# Patient Record
Sex: Male | Born: 1950 | Race: Black or African American | Hispanic: No | Marital: Single | Smoking: Former smoker
Health system: Southern US, Community
[De-identification: ages and names within clinical notes are randomized; demographics above are authoritative.]

## PROBLEM LIST (undated history)

## (undated) DIAGNOSIS — M1990 Unspecified osteoarthritis, unspecified site: Secondary | ICD-10-CM

## (undated) DIAGNOSIS — I10 Essential (primary) hypertension: Secondary | ICD-10-CM

## (undated) HISTORY — DX: Unspecified osteoarthritis, unspecified site: M19.90

## (undated) HISTORY — DX: Essential (primary) hypertension: I10

---

## 1980-10-17 HISTORY — PX: APPENDECTOMY: SHX54

## 1998-06-16 ENCOUNTER — Emergency Department (HOSPITAL_COMMUNITY): Admission: EM | Admit: 1998-06-16 | Discharge: 1998-06-16 | Payer: Self-pay | Admitting: Emergency Medicine

## 1998-06-22 ENCOUNTER — Encounter: Admission: RE | Admit: 1998-06-22 | Discharge: 1998-06-22 | Payer: Self-pay | Admitting: *Deleted

## 2001-12-05 ENCOUNTER — Emergency Department (HOSPITAL_COMMUNITY): Admission: EM | Admit: 2001-12-05 | Discharge: 2001-12-05 | Payer: Self-pay | Admitting: Emergency Medicine

## 2004-11-24 ENCOUNTER — Emergency Department (HOSPITAL_COMMUNITY): Admission: EM | Admit: 2004-11-24 | Discharge: 2004-11-24 | Payer: Self-pay | Admitting: Family Medicine

## 2006-01-09 ENCOUNTER — Ambulatory Visit: Payer: Self-pay | Admitting: Cardiology

## 2006-01-10 ENCOUNTER — Inpatient Hospital Stay (HOSPITAL_COMMUNITY): Admission: EM | Admit: 2006-01-10 | Discharge: 2006-01-11 | Payer: Self-pay | Admitting: Emergency Medicine

## 2006-08-28 ENCOUNTER — Emergency Department (HOSPITAL_COMMUNITY): Admission: EM | Admit: 2006-08-28 | Discharge: 2006-08-28 | Payer: Self-pay | Admitting: Emergency Medicine

## 2015-12-17 ENCOUNTER — Emergency Department (HOSPITAL_COMMUNITY)
Admission: EM | Admit: 2015-12-17 | Discharge: 2015-12-17 | Disposition: A | Discharge status: Home / Self Care | Payer: Self-pay | Attending: Emergency Medicine | Admitting: Emergency Medicine

## 2015-12-17 ENCOUNTER — Encounter (HOSPITAL_COMMUNITY): Payer: Self-pay | Admitting: Emergency Medicine

## 2015-12-17 ENCOUNTER — Emergency Department (HOSPITAL_COMMUNITY): Payer: Self-pay

## 2015-12-17 DIAGNOSIS — F17210 Nicotine dependence, cigarettes, uncomplicated: Secondary | ICD-10-CM | POA: Insufficient documentation

## 2015-12-17 DIAGNOSIS — W19XXXA Unspecified fall, initial encounter: Secondary | ICD-10-CM

## 2015-12-17 DIAGNOSIS — Y998 Other external cause status: Secondary | ICD-10-CM | POA: Insufficient documentation

## 2015-12-17 DIAGNOSIS — S4992XA Unspecified injury of left shoulder and upper arm, initial encounter: Secondary | ICD-10-CM | POA: Insufficient documentation

## 2015-12-17 DIAGNOSIS — Y9264 Mine or pit as the place of occurrence of the external cause: Secondary | ICD-10-CM | POA: Insufficient documentation

## 2015-12-17 DIAGNOSIS — W010XXA Fall on same level from slipping, tripping and stumbling without subsequent striking against object, initial encounter: Secondary | ICD-10-CM | POA: Insufficient documentation

## 2015-12-17 DIAGNOSIS — Y9389 Activity, other specified: Secondary | ICD-10-CM | POA: Insufficient documentation

## 2015-12-17 DIAGNOSIS — M25512 Pain in left shoulder: Secondary | ICD-10-CM

## 2015-12-17 MED ORDER — OXYCODONE-ACETAMINOPHEN 5-325 MG PO TABS: 1.0000 | ORAL_TABLET | ORAL | Status: DC | PRN

## 2015-12-17 MED ORDER — KETOROLAC TROMETHAMINE 60 MG/2ML IM SOLN
30.0000 mg | Freq: Once | INTRAMUSCULAR | Status: AC
  Administered 2015-12-17: 30 mg via INTRAMUSCULAR
  Filled 2015-12-17: qty 2

## 2015-12-17 MED ORDER — IBUPROFEN 600 MG PO TABS: 600.0000 mg | ORAL_TABLET | Freq: Four times a day (QID) | ORAL | Status: DC | PRN

## 2015-12-17 MED ORDER — METHOCARBAMOL 500 MG PO TABS: 500.0000 mg | ORAL_TABLET | Freq: Two times a day (BID) | ORAL | Status: DC

## 2015-12-17 NOTE — ED Provider Notes (Signed)
CSN: [id number]     Arrival date & time 12/17/15  1553 History  By signing my name below, I, Soijett Blue, attest that this documentation has been prepared under the direction and in the presence of Diyari Cherne, PA-C Electronically Signed: Soijett Blue, ED Scribe. 12/17/2015. 5:48 PM.    Chief Complaint  Patient presents with  . Shoulder Pain      The history is provided by the patient. No language interpreter was used.    Graiden Henes Ogburn is a 65 y.o. male who presents to the Emergency Department complaining of 8/10 left shoulder pain onset yesterday night. He notes that he tripped and fell onto gravel yesterday night and landed on his left shoulder. He notes that today at work, he began to experience pain while lifting his left arm. Denies injuring his left shoulder in the past, or having surgery on the left shoulder. He notes that he has not tried any medications for the relief of his symptoms. Patient denies LOC, head trauma, shortness of breath, chest pain, neuro deficits, or any other complaints.     History reviewed. No pertinent past medical history. History reviewed. No pertinent past surgical history. History reviewed. No pertinent family history. Social History  Substance Use Topics  . Smoking status: Current Every Day Smoker -- 0.10 packs/day    Types: Cigarettes  . Smokeless tobacco: None  . Alcohol Use: None    Review of Systems  Musculoskeletal: Positive for arthralgias (Left shoulder). Negative for back pain, joint swelling and neck pain.  Skin: Negative for color change, rash and wound.  Neurological: Negative for dizziness, light-headedness and headaches.      Allergies  Review of patient's allergies indicates no known allergies.  Home Medications   Prior to Admission medications   Medication Sig Start Date End Date Taking? Authorizing Provider  ibuprofen (ADVIL,MOTRIN) 600 MG tablet Take 1 tablet (600 mg total) by mouth every 6 (six) hours as needed.  12/17/15   Arneta Mahmood C Gwyn Mehring, PA-C  methocarbamol (ROBAXIN) 500 MG tablet Take 1 tablet (500 mg total) by mouth 2 (two) times daily. 12/17/15   Shira Bobst C Kiowa Hollar, PA-C  oxyCODONE-acetaminophen (PERCOCET/ROXICET) 5-325 MG tablet Take 1-2 tablets by mouth every 4 (four) hours as needed for severe pain. 12/17/15   Dvontae Ruan C Deborha Moseley, PA-C   BP 137/87 mmHg  Pulse 70  Temp(Src) 98 F (36.7 C) (Oral)  Resp 16  SpO2 100% Physical Exam  Constitutional: He is oriented to person, place, and time. He appears well-developed and well-nourished. No distress.  HENT:  Head: Normocephalic and atraumatic.  Eyes: Conjunctivae are normal.  Neck: Neck supple.  Cardiovascular: Normal rate and intact distal pulses.   Pulmonary/Chest: Effort normal. No respiratory distress.  Musculoskeletal: Normal range of motion.  Left shoulder has full passive and active ROM with pain. Full range of motion in all other extremities and spine. No paraspinal tenderness.  Neurological: He is alert and oriented to person, place, and time. He has normal strength and normal reflexes. No sensory deficit.  No sensory deficit. Strength 5/5.   Skin: Skin is warm and dry.  Psychiatric: He has a normal mood and affect. His behavior is normal.  Nursing note and vitals reviewed.   ED Course  Procedures (including critical care time) DIAGNOSTIC STUDIES: Oxygen Saturation is 100% on RA, nl by my interpretation.    COORDINATION OF CARE: 5:47 PM Discussed treatment plan with pt at bedside which includes left shoulder xray, toradol injection, referral and follow up  with orthopedist, and pt agreed to plan.     Imaging Review Dg Shoulder Left  12/17/2015  CLINICAL DATA:  Fall on left shoulder while running yesterday. Left shoulder pain. EXAM: LEFT SHOULDER - 2+ VIEW COMPARISON:  01/10/2006 FINDINGS: No dislocation. Subacromial morphology is type 2 (curved). AC joint alignment normal. Questionable tiny ossific structure along the inferior margin of the glenoid,  3 by 1.5 mm, conceivably a tiny fragmented spur or residua from a prior bony injury. Unlikely to be an acute bony Bankart lesion. IMPRESSION: 1. Tiny ossific structure along the inferior margin of the glenoid, probably chronic given the well corticated appearance, but not readily seen on the prior CT chest from 01/10/2006. The possibility of a bony Bankart lesion is considered low but not totally excluded. Electronically Signed   By: Gaylyn Rong M.D.   On: 12/17/2015 16:34   I have personally reviewed and evaluated these images as part of my medical decision-making.   EKG Interpretation None      MDM   Final diagnoses:  Fall, initial encounter  Shoulder pain, acute, left    Michall Noffke Rieger presents with left shoulder pain following a trip and fall yesterday.  No evidence of dislocation or fracture on x-ray. Patient has no neuro or functional deficits. Patient improved with conservative management here in the ED. Home care and return precautions discussed. Patient to follow-up with orthopedics as soon as possible. Patient placed in a shoulder immobilizer. Patient appears safe for discharge at this time.  I personally performed the services described in this documentation, which was scribed in my presence. The recorded information has been reviewed and is accurate.   Anselm Pancoast, PA-C 12/17/15 1846  Doug Sou, MD 12/18/15 Jacinta Shoe

## 2015-12-17 NOTE — ED Notes (Signed)
Pt c/o left shoulder pain after trip and fall yesterday evening. Point tenderness over left proximal humerus. Radial pulse 2+, sensation and motor function intact.

## 2015-12-17 NOTE — Discharge Instructions (Signed)
You have been seen today for shoulder pain. Your imaging showed no definite abnormalities. Follow up with orthopedics as soon as possible for reevaluation and chronic management. Follow up with PCP as needed. Return to ED should symptoms worsen.  RESOURCE GUIDE  Chronic Pain Problems: Contact [facility]  [phone] Patients need to be referred by their primary care doctor.  Insufficient Money for Medicine: Contact United Way:  call "211" or [facility] [phone].  No Primary Care Doctor: - Call [facility]  [phone] - can help you locate a primary care doctor that  accepts your insurance, provides certain services, etc. - Physician Referral Service- [phone]  Agencies that provide inexpensive medical care: - [facility]  [phone] - [facility]  [phone] - [facility]  [phone] - Women's Clinic  [phone] - Planned Parenthood  [phone] [facility]  [phone]  Medicaid-accepting [facility] Providers: - [facility]- [address]  [phone], Mon-Fri 9am-7pm, Sat 9am-1pm - [facility]- [address] [place], [address] [place]  [phone] - [facility]- [address], [address] [place]  [phone] [facility]- [address]  [phone] - Renaye Rakers- [address] [place], [address], [phone]  Only accepts [place] patients after they have their name  applied to their card  Self Pay (no insurance) in [place]: - Sickle Cell Patients: Dr Willey Blade, [facility]  [address] [place], [phone] - [facility]- [address] [place]  [phone]       [facility] [place]- [address]       -     [facility]- see information above (Speak to [employer] if you do not have insurance)       -  [facility]- [address] [place],  [phone]       -  [facility]- [address],  [phone]       -  [facility]- [address] [phone]       -  Dr Julio Sicks-  [address], [place], [phone]       -  [facility]- [address] [phone]       -  [facility]- [address], [phone], also [address], [phone]       -    [facility]- [address] [place], [phone], 1st & 3rd Saturday   every month, 10am-1pm  1) Find a Doctor and Pay Out of Pocket Although you won't have to find out who is covered by your insurance plan, it is a good idea to ask around and get recommendations. You will then need to call the office and see if the doctor you have chosen will accept you as a new patient and what types of options they offer for patients who are self-pay. Some doctors offer discounts or will set up payment plans for their patients who do not have insurance, but you will need to ask so you aren't surprised when you get to your appointment.  2) Contact Your Local Health Department Not all health departments have doctors that can see patients for sick visits, but many do, so it is worth a call to see  if yours does. If you don't know where your local health department is, you can check in your phone book. The CDC also has a tool to help you locate your state's health department, and many state websites also have listings of all of their local health departments.  3) Find a Walk-in Clinic If your illness is not likely to be very severe or complicated, you may want to try a walk in clinic. These are popping up all over the country in pharmacies, drugstores, and shopping centers. They're usually staffed by nurse practitioners or physician assistants that have been trained to treat common illnesses and complaints. They're usually fairly quick and inexpensive. However, if you have serious medical issues or chronic medical problems, these are  probably not your best option  STD Testing - [facility] Department of [facility] [place], STD Clinic, [address], [place], phone [phone] or [phone].  Monday - Friday, call for an appointment. [facility] Department of [employer], STD Clinic, [address], [place], phone [phone] or [phone].  Monday - Friday, call for an appointment.  Abuse/Neglect: [facility] Child Abuse Hotline (249)820-7964 [facility] Child Abuse Hotline 769-247-6227 (After Hours)  Emergency Shelter:  [facility] (251)245-1566  Maternity Homes: - [facility] at [facility] [place] [facility] 914-099-3311 - [facility] [phone]  MRSA Hotline #:   386-270-6536  [facility] Resources  [facility] [place]  United Way [facility] Dept. [address]                 [address]         [address] [place] [address]  Blondell Reveal Phone:  [phone]                                  Phone:  [phone]                   Phone:  [phone]  [facility], [phone] - [phone]       -     [facility] in [place], [address]                                  [phone], [facility] Child Abuse Hotline 520-323-0906 or 9525882712 (After Hours)   Behavioral Health Services  Substance Abuse Resources: - [facility] Drug [facility]  [phone] - [facility] [phone] - [facility] [place] [phone] Floydene Flock 708-225-3333 - Residential & Outpatient Substance Abuse Program  9084639054  Psychological Services: [facility]  [phone] Services  [phone] - [facility], [phone] [place]. [address] [place], ACCESS LINE:  971-333-1096 or 832-545-2701, [employer]  Dental Assistance  If unable to pay or  uninsured, contact:  [facility] or [facility]. to become qualified for the adult dental clinic.  Patients with Medicaid: [facility] [phone] [address], [phone] [address], [phone]  If unable to pay, or uninsured, contact [facility] [phone]) or [facility] Department [phone] in [place], [phone] in [facility]) to become qualified for the adult dental clinic   Other Low-Cost Community Dental Services: - [facility]- [address] [place], [place], [place], [address], [phone], [address]. [phone], 2nd and 4th Thursday of the month at 6:30am.  10 clients each day by appointment, can sometimes see walk-in patients if someone does not show for an appointment. [facility]- [address] [place], [place], [address], [phone] - [facility]- [address], [place], [place], [address], [phone] - [facility]- [phone] [facility]- [phone] [facility] Department- [phone]

## 2016-12-23 IMAGING — CR DG SHOULDER 2+V*L*
3 series · 3 of 3 positions shown · non-contrast
Comparison: 01/10/2006

CLINICAL DATA: Fall on left shoulder while running yesterday. Left
shoulder pain.

EXAM:
LEFT SHOULDER - 2+ VIEW

[w shoulder external left]
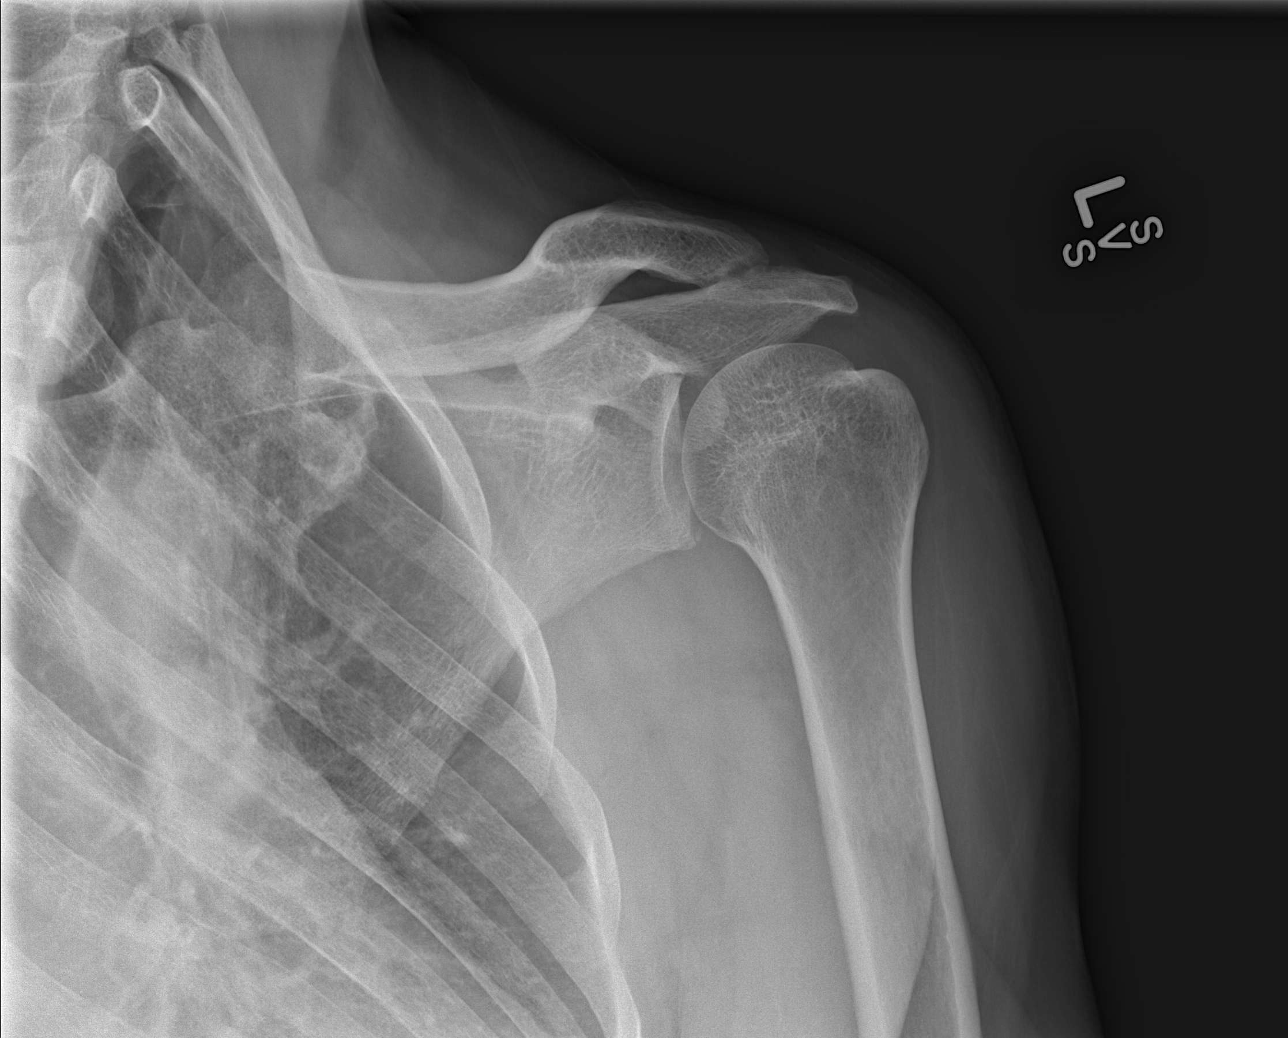

[w shoulder y-view left]
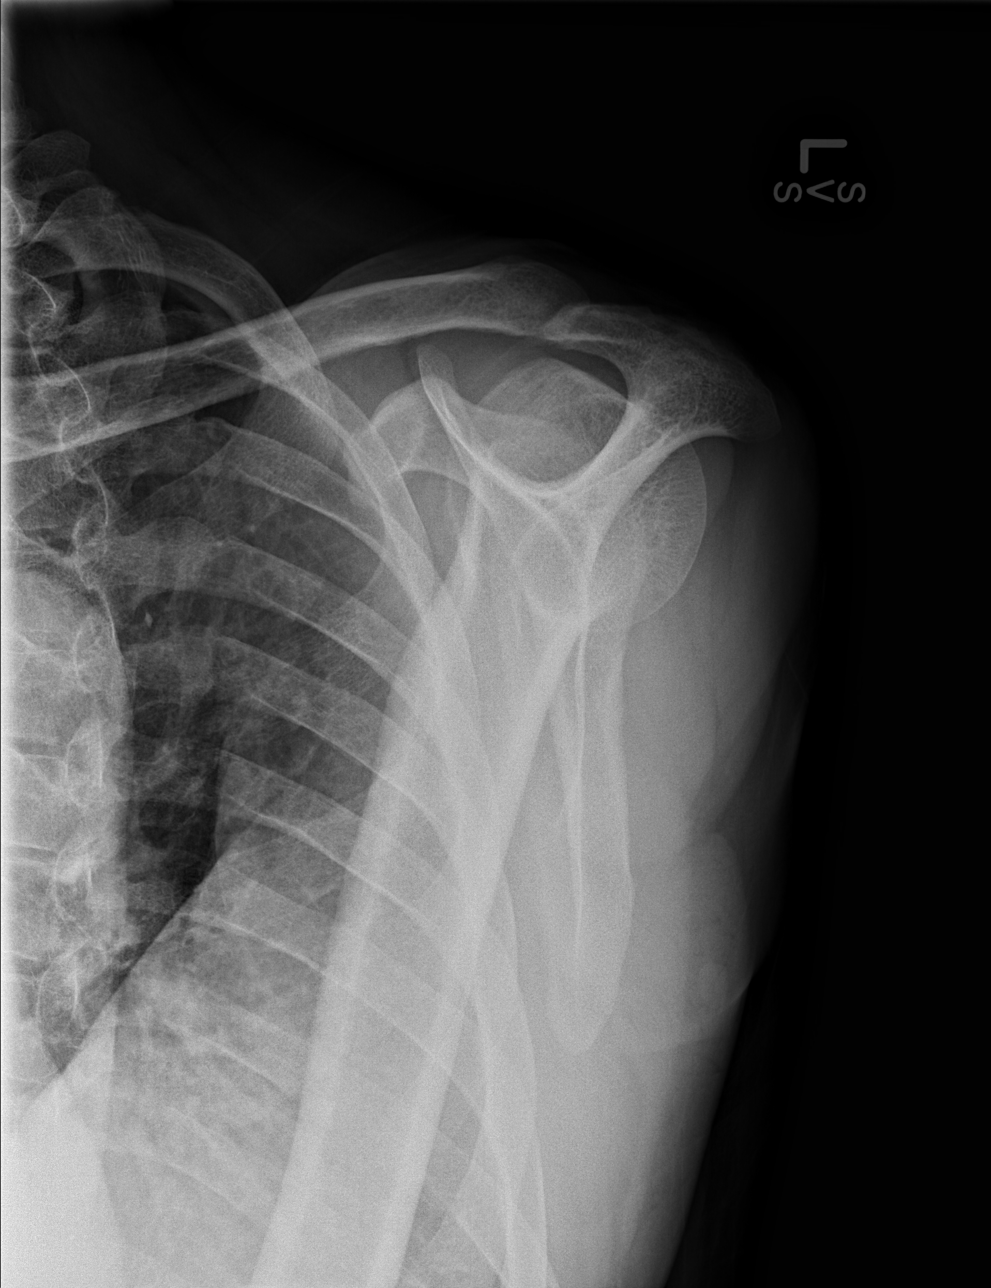

[x shoulder axillary left]
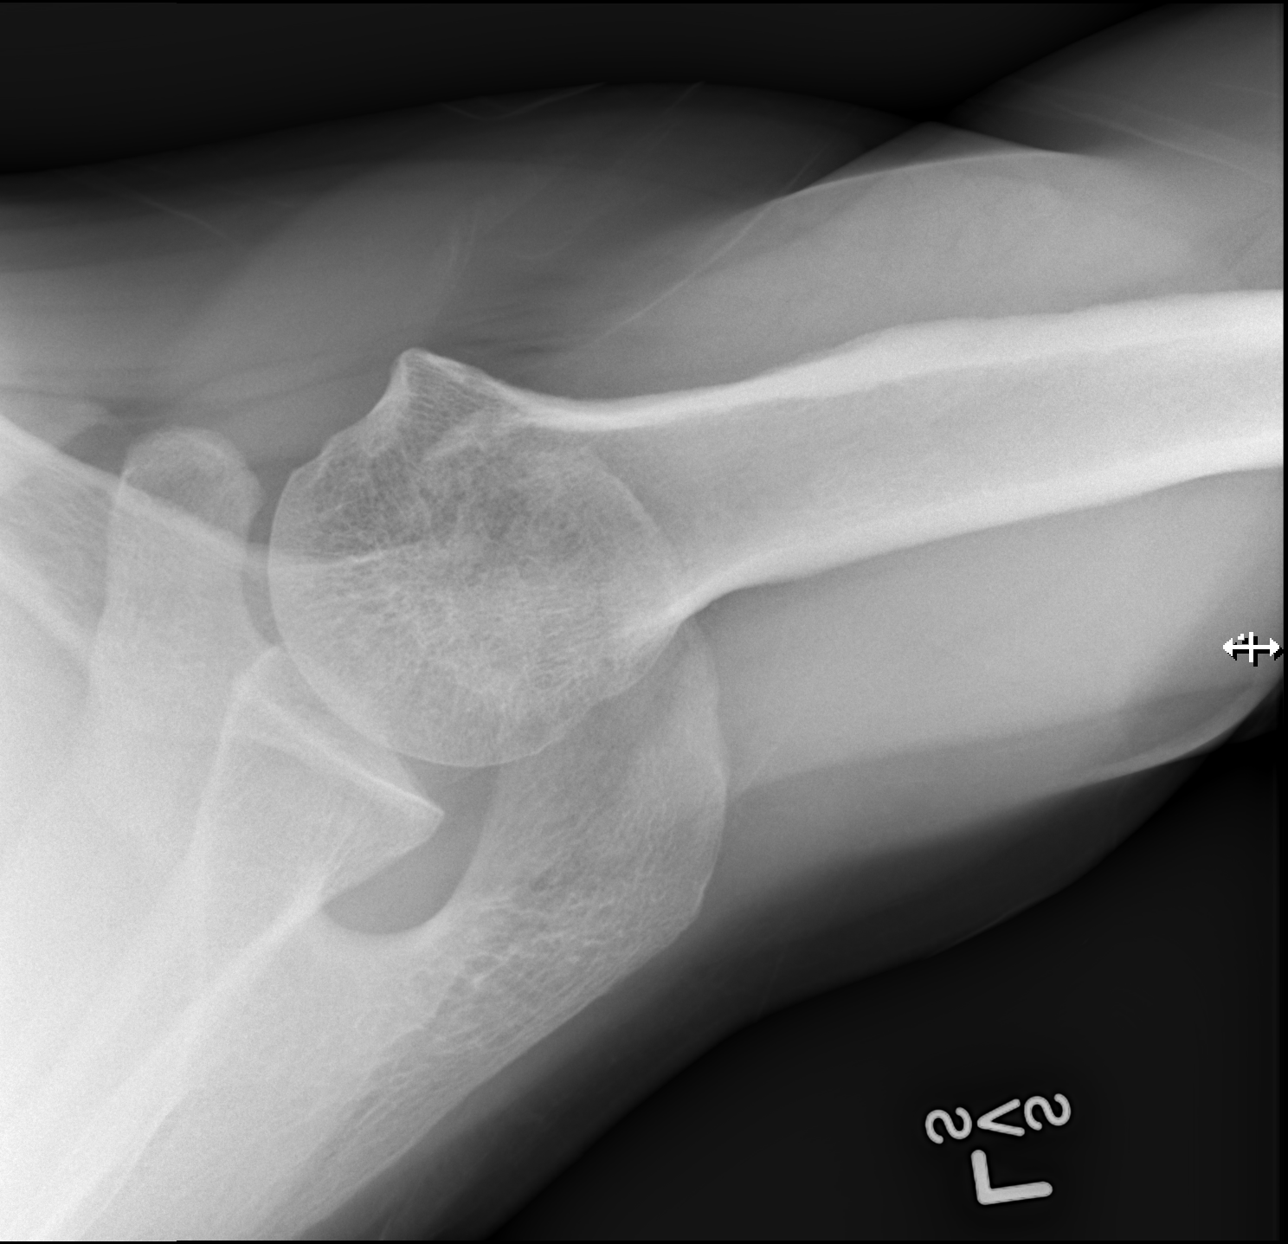

[3 of 3 positions shown; findings below may reference images not displayed]

FINDINGS: No dislocation. Subacromial morphology is type 2 (curved). AC joint
alignment normal. Questionable tiny ossific structure along the
inferior margin of the glenoid, 3 by 1.5 mm, conceivably a tiny
fragmented spur or residua from a prior bony injury. Unlikely to be
an acute bony Bankart lesion.
IMPRESSION: 1. Tiny ossific structure along the inferior margin of the glenoid,
probably chronic given the well corticated appearance, but not
readily seen on the prior CT chest from 01/10/2006. The possibility
of a bony Bankart lesion is considered low but not totally excluded.

## 2017-01-30 ENCOUNTER — Telehealth: Payer: Self-pay | Admitting: General Practice

## 2017-01-30 NOTE — Telephone Encounter (Signed)
I called the patient to confirm whether or not he has a PCP.  There was no answer and no option to leave a voicemail message. Delton Prairie (PSC)

## 2017-02-01 NOTE — Telephone Encounter (Signed)
2nd attempt to contact patient and confirm PCP.  There was no answer nor voicemail option, and the patient has returned mail. Tommy Young (PSC)

## 2017-02-03 NOTE — Telephone Encounter (Signed)
3rd attempt to contact patient and confirm PCP. There was no answer nor voicemail option. Tommy Young (PSC)

## 2017-10-18 ENCOUNTER — Encounter (HOSPITAL_COMMUNITY): Payer: Self-pay

## 2017-10-18 ENCOUNTER — Emergency Department (HOSPITAL_COMMUNITY)
Admission: EM | Admit: 2017-10-18 | Discharge: 2017-10-18 | Disposition: A | Discharge status: Home / Self Care | Payer: Medicare Other | Attending: Emergency Medicine | Admitting: Emergency Medicine

## 2017-10-18 ENCOUNTER — Other Ambulatory Visit: Payer: Self-pay

## 2017-10-18 ENCOUNTER — Emergency Department (HOSPITAL_COMMUNITY): Payer: Medicare Other

## 2017-10-18 DIAGNOSIS — J189 Pneumonia, unspecified organism: Secondary | ICD-10-CM | POA: Diagnosis not present

## 2017-10-18 DIAGNOSIS — H81399 Other peripheral vertigo, unspecified ear: Secondary | ICD-10-CM

## 2017-10-18 DIAGNOSIS — Z79899 Other long term (current) drug therapy: Secondary | ICD-10-CM | POA: Diagnosis not present

## 2017-10-18 DIAGNOSIS — R5381 Other malaise: Secondary | ICD-10-CM | POA: Diagnosis not present

## 2017-10-18 DIAGNOSIS — R05 Cough: Secondary | ICD-10-CM | POA: Diagnosis present

## 2017-10-18 DIAGNOSIS — F17210 Nicotine dependence, cigarettes, uncomplicated: Secondary | ICD-10-CM | POA: Insufficient documentation

## 2017-10-18 LAB — BASIC METABOLIC PANEL
Anion gap: 10 (ref 5–15)
BUN: 11 mg/dL (ref 6–20)
CO2: 26 mmol/L (ref 22–32)
CREATININE: 1.31 mg/dL — AB (ref 0.61–1.24)
Calcium: 9 mg/dL (ref 8.9–10.3)
Chloride: 100 mmol/L — ABNORMAL LOW (ref 101–111)
GFR calc Af Amer: >60 mL/min (ref >60)
GFR, EST NON AFRICAN AMERICAN: 55 mL/min — AB (ref >60)
GLUCOSE: 133 mg/dL — AB (ref 65–99)
POTASSIUM: 3.3 mmol/L — AB (ref 3.5–5.1)
SODIUM: 136 mmol/L (ref 135–145)

## 2017-10-18 LAB — CBC
HEMATOCRIT: 46.5 % (ref 39.0–52.0)
Hemoglobin: 15.7 g/dL (ref 13.0–17.0)
MCH: 28.9 pg (ref 26.0–34.0)
MCHC: 33.8 g/dL (ref 30.0–36.0)
MCV: 85.5 fL (ref 78.0–100.0)
PLATELETS: 196 10*3/uL (ref 150–400)
RBC: 5.44 MIL/uL (ref 4.22–5.81)
RDW: 13.5 % (ref 11.5–15.5)
WBC: 4.1 10*3/uL (ref 4.0–10.5)

## 2017-10-18 LAB — I-STAT TROPONIN, ED: Troponin i, poc: 0.01 ng/mL (ref 0.00–0.08)

## 2017-10-18 MED ORDER — MECLIZINE HCL 25 MG PO TABS: 25.0000 mg | ORAL_TABLET | Freq: Three times a day (TID) | ORAL | 0 refills | Status: AC | PRN

## 2017-10-18 MED ORDER — DOXYCYCLINE HYCLATE 100 MG PO CAPS: 100.0000 mg | ORAL_CAPSULE | Freq: Two times a day (BID) | ORAL | 0 refills | Status: DC

## 2017-10-18 MED ORDER — MECLIZINE HCL 25 MG PO TABS
25.0000 mg | ORAL_TABLET | Freq: Once | ORAL | Status: AC
  Administered 2017-10-18: 25 mg via ORAL
  Filled 2017-10-18: qty 1

## 2017-10-18 MED ORDER — SODIUM CHLORIDE 0.9 % IV BOLUS (SEPSIS)
1000.0000 mL | Freq: Once | INTRAVENOUS | Status: AC
  Administered 2017-10-18: 1000 mL via INTRAVENOUS

## 2017-10-18 NOTE — ED Triage Notes (Signed)
Patient complains of dizziness with position change since Friday. Also has had cough with chest wall pain and headache. Alert and oriented, no neuro deficits

## 2017-10-18 NOTE — ED Provider Notes (Signed)
[facility] EMERGENCY DEPARTMENT Provider Note   CSN: [id number] Arrival date & time: 10/18/17  1023     History   Chief Complaint Chief Complaint  Patient presents with  . dizziness/CP    HPI Tommy Young is a 67 y.o. male.  Patient is a 67 year old male with no significant past medical history presenting today with cough, malaise and dizziness.  Patient states approximately 6 days ago he started not feeling well.  He complains of a dry cough, bilateral rib pain and no appetite.  Family is concerned that he is dehydrated because he has not been eating or drinking.  Patient denies any regular alcohol use and does not smoke regularly.  He also has noticed dizziness over the last 6 days but worse in the last 2.  He describes it as a's bending and steady sensation when he tries to walk.  He states he feels like he stumbling around and has to hold on the wall to steady himself.  No loss of consciousness or syncope.  Moving his head side to side standing up and attempting to walk make the symptoms much worse.  He has had some nausea but denies any vomiting.  No fever, diarrhea, abdominal pain or shortness of breath.   The history is provided by the patient and a relative.    History reviewed. No pertinent past medical history.  There are no active problems to display for this patient.   History reviewed. No pertinent surgical history.     Home Medications    Prior to Admission medications   Medication Sig Start Date End Date Taking? Authorizing Provider  ibuprofen (ADVIL,MOTRIN) 600 MG tablet Take 1 tablet (600 mg total) by mouth every 6 (six) hours as needed. 12/17/15   Joy, Shawn C, PA-C  methocarbamol (ROBAXIN) 500 MG tablet Take 1 tablet (500 mg total) by mouth 2 (two) times daily. 12/17/15   Joy, Shawn C, PA-C  oxyCODONE-acetaminophen (PERCOCET/ROXICET) 5-325 MG tablet Take 1-2 tablets by mouth every 4 (four) hours as needed for severe pain. 12/17/15   Joy,  Hillard Danker, PA-C    Family History No family history on file.  Social History Social History   Tobacco Use  . Smoking status: Current Every Day Smoker    Packs/day: 0.10    Types: Cigarettes  . Smokeless tobacco: Never Used  Substance Use Topics  . Alcohol use: Not on file  . Drug use: Not on file     Allergies   Patient has no known allergies.   Review of Systems Review of Systems  All other systems reviewed and are negative.    Physical Exam Updated Vital Signs BP 109/72 (BP Location: Left Arm)   Pulse 80   Temp 98.6 F (37 C) (Oral)   Resp 16   SpO2 100%   Physical Exam  Constitutional: He is oriented to person, place, and time. He appears well-developed and well-nourished. No distress.  HENT:  Head: Normocephalic and atraumatic.  Mouth/Throat: Oropharynx is clear and moist.  Eyes: Conjunctivae and EOM are normal. Pupils are equal, round, and reactive to light.  Neck: Normal range of motion. Neck supple.  Cardiovascular: Normal rate, regular rhythm and intact distal pulses.  No murmur heard. Pulmonary/Chest: Effort normal and breath sounds normal. No respiratory distress. He has no wheezes. He has no rales. He exhibits tenderness.  Tenderness with palpation of the lower rib cage bilaterally  Abdominal: Soft. He exhibits no distension. There is no tenderness. There  is no rebound and no guarding.  Musculoskeletal: Normal range of motion. He exhibits no edema or tenderness.  Neurological: He is alert and oriented to person, place, and time. He has normal strength. No cranial nerve deficit or sensory deficit. Coordination and gait normal.  Patient able to walk around the department without signs of ataxia.  No notable nystagmus on exam  Skin: Skin is warm and dry. No rash noted. No erythema.  Psychiatric: He has a normal mood and affect. His behavior is normal.  Nursing note and vitals reviewed.    ED Treatments / Results  Labs (all labs ordered are listed,  but only abnormal results are displayed) Labs Reviewed  BASIC METABOLIC PANEL - Abnormal; Notable for the following components:      Result Value   Potassium 3.3 (*)    Chloride 100 (*)    Glucose, Bld 133 (*)    Creatinine, Ser 1.31 (*)    GFR calc non Af Amer 55 (*)    All other components within normal limits  CBC  I-STAT TROPONIN, ED    EKG  EKG Interpretation  Date/Time:  Wednesday October 18 2017 10:57:15 EST Ventricular Rate:  92 PR Interval:  122 QRS Duration: 88 QT Interval:  360 QTC Calculation: 445 R Axis:   67 Text Interpretation:  Normal sinus rhythm Biatrial enlargement No significant change since last tracing Confirmed by Gwyneth Sproutlunkett, Fonnie Crookshanks ([id number]) on 10/18/2017 12:34:02 PM       Radiology Dg Chest 2 View  Result Date: 10/18/2017 CLINICAL DATA:  67 year old male with a history of dizziness EXAM: CHEST  2 VIEW COMPARISON:  Chest x-ray 01/10/2006, CT 01/10/2006 FINDINGS: Cardiomediastinal silhouette unchanged in size and contour. Interval development of patchy opacity at the left greater than right lung base, present on the lateral view. No pneumothorax or pleural effusion. IMPRESSION: Patchy opacity at the left greater than right lung base, potentially developing infection or alternatively atelectasis. Followup PA and lateral chest X-ray is recommended in 3-4 weeks following therapy to ensure resolution. Electronically Signed   By: Gilmer MorJaime  Wagner D.O.   On: 10/18/2017 11:19    Procedures Procedures (including critical care time)  Medications Ordered in ED Medications  sodium chloride 0.9 % bolus 1,000 mL (not administered)  meclizine (ANTIVERT) tablet 25 mg (not administered)     Initial Impression / Assessment and Plan / ED Course  I have reviewed the triage vital signs and the nursing notes.  Pertinent labs & imaging results that were available during my care of the patient were reviewed by me and considered in my medical decision making (see chart for  details).     Healthy 67 year old male presenting today with cough, general malaise and dizziness.  Dizziness seems to be vertiginous.  Low suspicion for stroke at this time.  Patient has no neurologic findings consistent with stroke and is able to walk without evidence of ataxia.  He was given meclizine with improvement of those symptoms.  Secondly concern for dehydration as patient has not been eating or drinking well and chest x-ray is consistent with pneumonia.  Low suspicion that patient has PE as he does not have significant risk factors he is not tachycardic or hypoxic.  Also the cough is new since Friday.  Will treat with doxycycline.  Labs are otherwise normal.  Low suspicion for cardiac causes at this time.  Patient has a normal EKG and troponin is negative.  Final Clinical Impressions(s) / ED Diagnoses   Final diagnoses:  Peripheral  vertigo, unspecified laterality  Community acquired pneumonia, unspecified laterality    ED Discharge Orders        Ordered    doxycycline (VIBRAMYCIN) 100 MG capsule  2 times daily     10/18/17 1457    meclizine (ANTIVERT) 25 MG tablet  3 times daily PRN     10/18/17 1457       Gwyneth Sprout, MD 10/18/17 1458

## 2018-03-29 ENCOUNTER — Encounter

## 2018-10-25 IMAGING — CR DG CHEST 2V
2 series · 2 of 2 positions shown · non-contrast
Comparison: Chest x-ray 01/10/2006, CT 01/10/2006

CLINICAL DATA: 66-year-old male with a history of dizziness

EXAM:
CHEST  2 VIEW

[chest pa]
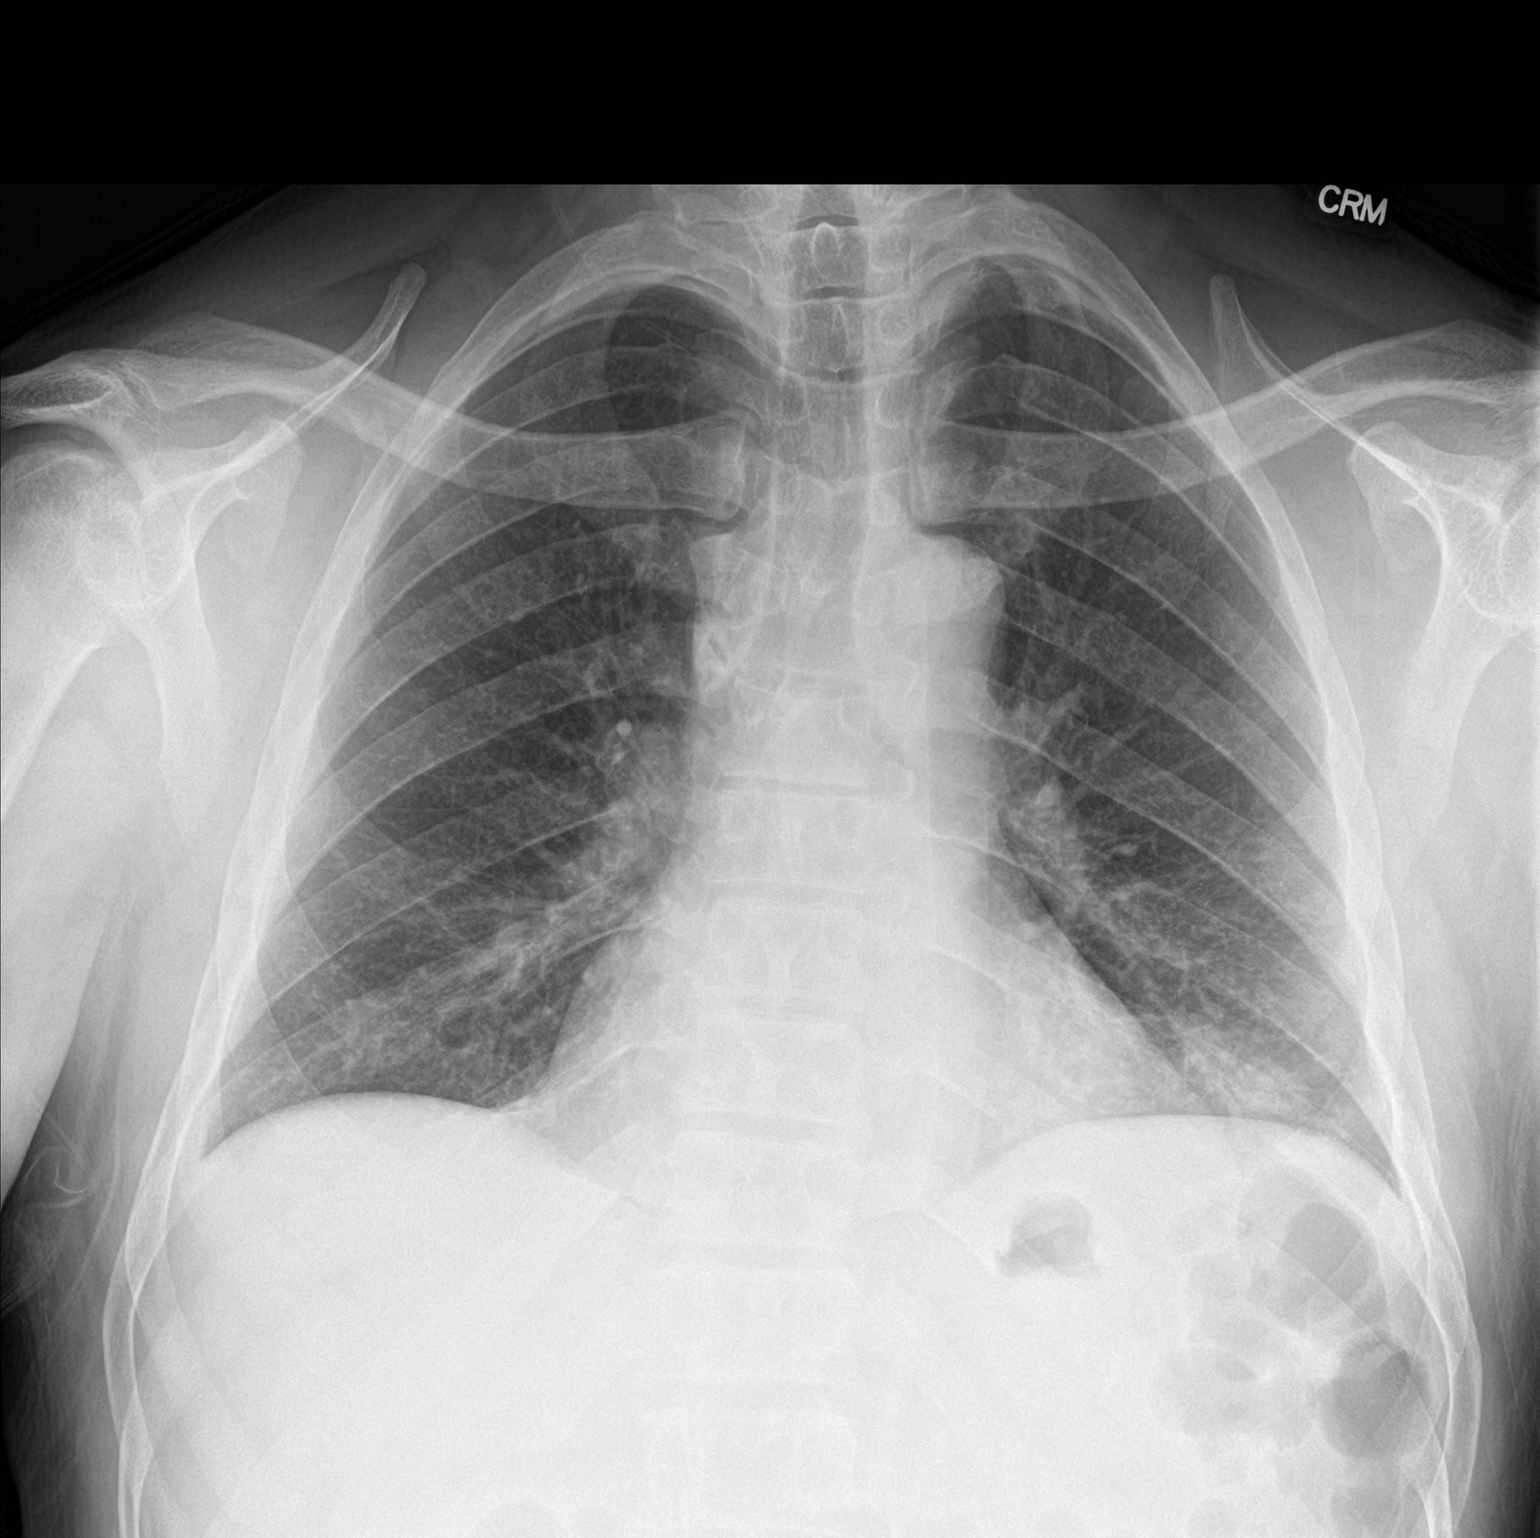

[chest lat]
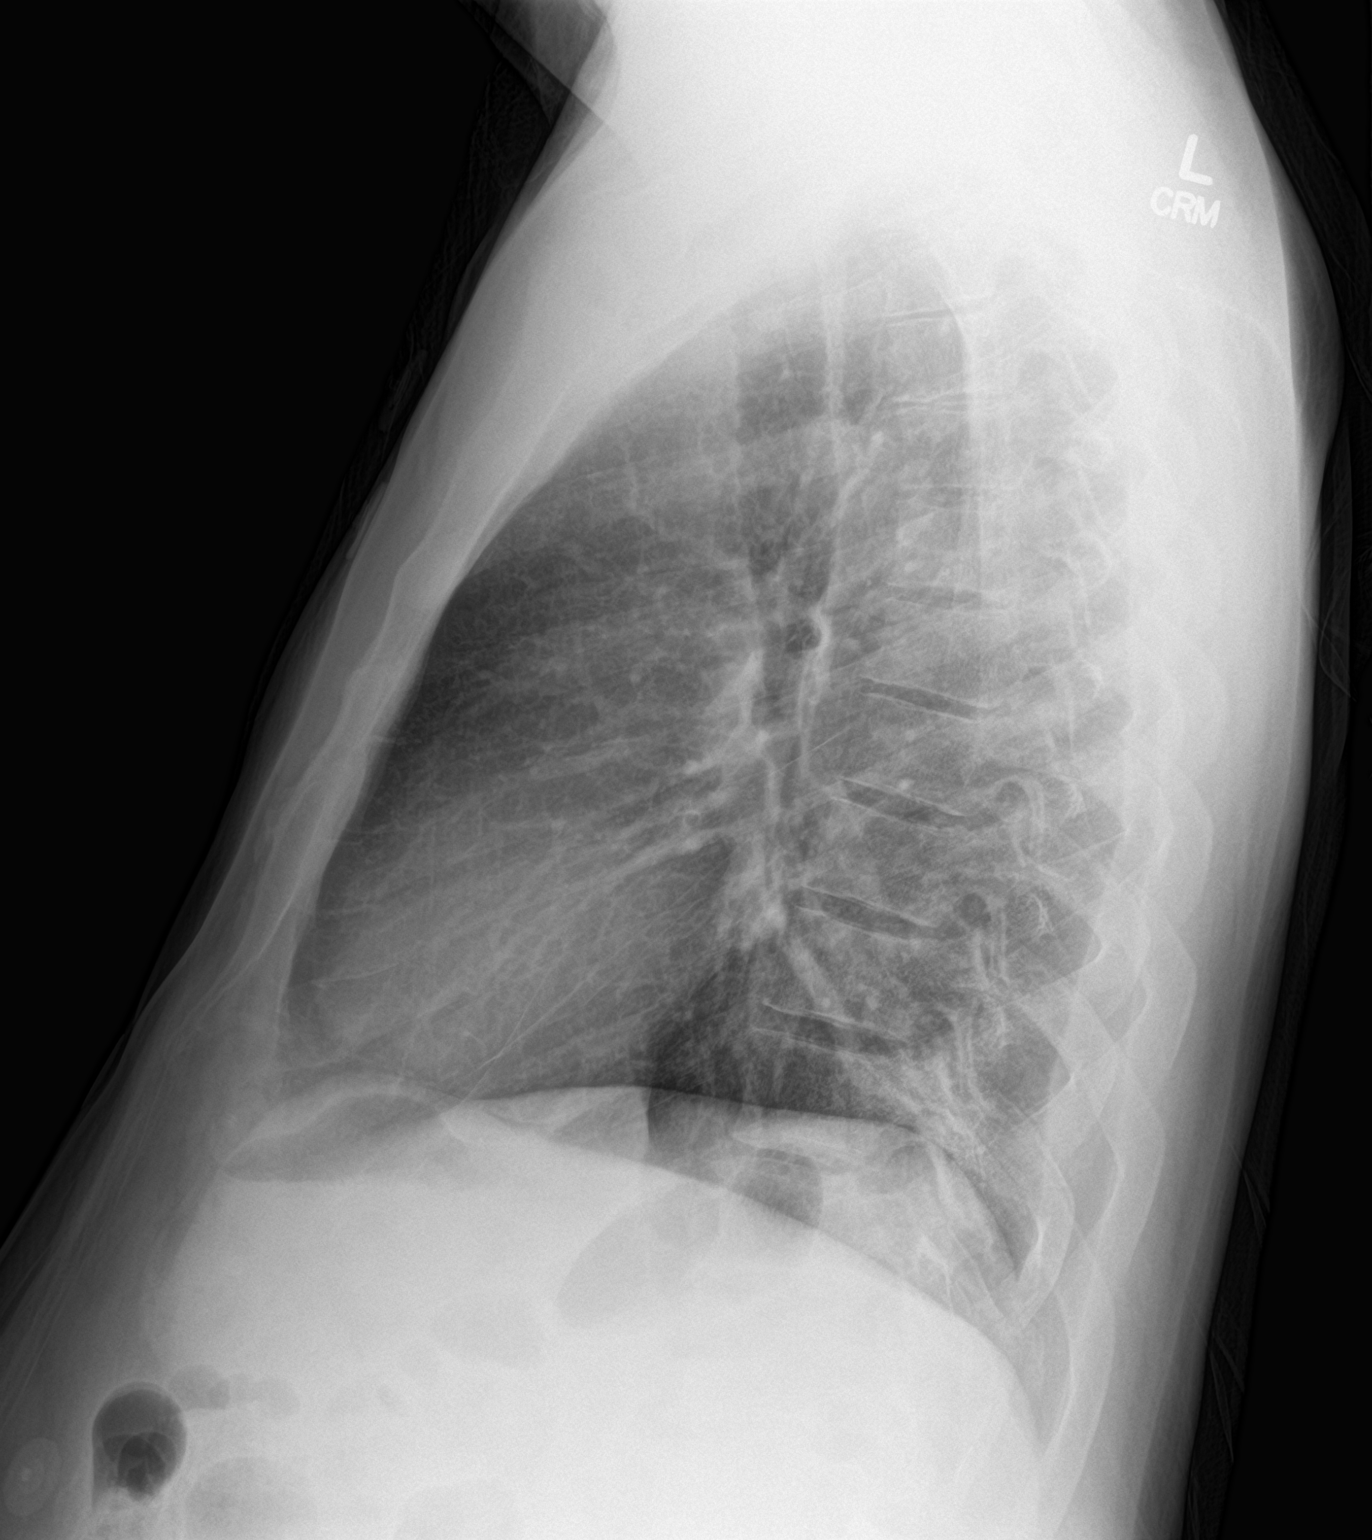

[2 of 2 positions shown; findings below may reference images not displayed]

FINDINGS: Cardiomediastinal silhouette unchanged in size and contour.

Interval development of patchy opacity at the left greater than
right lung base, present on the lateral view.

No pneumothorax or pleural effusion.
IMPRESSION: Patchy opacity at the left greater than right lung base, potentially
developing infection or alternatively atelectasis. Followup PA and
lateral chest X-ray is recommended in 3-4 weeks following therapy to
ensure resolution.

## 2019-07-22 DIAGNOSIS — R638 Other symptoms and signs concerning food and fluid intake: Secondary | ICD-10-CM | POA: Diagnosis not present

## 2019-08-21 DIAGNOSIS — Z823 Family history of stroke: Secondary | ICD-10-CM | POA: Diagnosis not present

## 2019-08-21 DIAGNOSIS — Z7722 Contact with and (suspected) exposure to environmental tobacco smoke (acute) (chronic): Secondary | ICD-10-CM | POA: Diagnosis not present

## 2019-08-21 DIAGNOSIS — R42 Dizziness and giddiness: Secondary | ICD-10-CM | POA: Diagnosis not present

## 2019-08-21 DIAGNOSIS — Z87891 Personal history of nicotine dependence: Secondary | ICD-10-CM | POA: Diagnosis not present

## 2019-08-21 DIAGNOSIS — I10 Essential (primary) hypertension: Secondary | ICD-10-CM | POA: Diagnosis not present

## 2019-08-21 DIAGNOSIS — Z8249 Family history of ischemic heart disease and other diseases of the circulatory system: Secondary | ICD-10-CM | POA: Diagnosis not present

## 2022-03-04 ENCOUNTER — Other Ambulatory Visit: Payer: Self-pay | Admitting: Student

## 2022-03-04 ENCOUNTER — Ambulatory Visit
Admission: RE | Admit: 2022-03-04 | Discharge: 2022-03-04 | Disposition: A | Discharge status: Home / Self Care | Payer: Medicare Other | Source: Ambulatory Visit | Attending: Student | Admitting: Student

## 2022-03-04 DIAGNOSIS — M79674 Pain in right toe(s): Secondary | ICD-10-CM

## 2022-08-01 ENCOUNTER — Other Ambulatory Visit: Payer: Self-pay | Admitting: Student

## 2022-08-08 ENCOUNTER — Other Ambulatory Visit: Payer: Self-pay | Admitting: Student

## 2022-08-08 DIAGNOSIS — N6310 Unspecified lump in the right breast, unspecified quadrant: Secondary | ICD-10-CM

## 2022-08-09 ENCOUNTER — Encounter: Payer: Self-pay | Admitting: Internal Medicine

## 2022-08-09 ENCOUNTER — Ambulatory Visit: Payer: Medicare Other

## 2022-08-09 ENCOUNTER — Ambulatory Visit (INDEPENDENT_AMBULATORY_CARE_PROVIDER_SITE_OTHER): Payer: Medicare Other

## 2022-08-09 ENCOUNTER — Ambulatory Visit: Payer: Medicare Other | Attending: Internal Medicine | Admitting: Internal Medicine

## 2022-08-09 DIAGNOSIS — M79642 Pain in left hand: Secondary | ICD-10-CM

## 2022-08-09 DIAGNOSIS — R768 Other specified abnormal immunological findings in serum: Secondary | ICD-10-CM | POA: Insufficient documentation

## 2022-08-09 DIAGNOSIS — M79674 Pain in right toe(s): Secondary | ICD-10-CM | POA: Diagnosis present

## 2022-08-09 DIAGNOSIS — M79641 Pain in right hand: Secondary | ICD-10-CM | POA: Diagnosis present

## 2022-08-09 NOTE — Progress Notes (Signed)
Office Visit Note  Patient: Tommy Young             Date of Birth: 27-Jan-1951           MRN: [id number]             PCP: Cipriano Mile, NP Referring: Cipriano Mile, NP Visit Date: 08/09/2022 Occupation: Retired, produce truck driver  Subjective:  New Patient (Initial Visit) (Joint pain, soreness in fingers and feet with swelling. )   History of Present Illness: Tommy Young is a 71 y.o. male here for evaluation of positive ANA associated with joint pains.  Symptoms started since earlier this year he was initially seen in primary care office about the problem in April but had some right great toe swelling for at least weeks or possibly a few months prior to this.  He was treated with indomethacin with a good improvement in that pain and swelling.  However sometime between April and June also developed worsening joint pain and stiffness in other areas particularly bilateral hands.  This was not associated with much visible swelling but does have daily pain.  Gets a little bit better during the day but never fully gone and only partially improved when taking the indomethacin.  He denies any numbness denies lack of strength but has difficulty gripping tightly for example to open jars due to the associated pain.  He is also often unable to fully close his grip range of motion.  He does not recall any preceding illness or medical changes before the onset of symptoms.  Laboratory testing showed a low positive ANA his uric acid was in normal range at 6.5 and sedimentation rate was normal at 11.  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  He is never experienced symptoms similar to this in the past.  He has no previous history of gout.  Labs reviewed ANA 1:40 speckled 1:40 cytoplasmic Uric acid 6.5 ESR 11 CBC unremarkable  Activities of Daily Living:  Patient reports  morning stiffness for 2-3 minutes.   Patient Denies nocturnal pain.  Difficulty dressing/grooming: Denies Difficulty climbing stairs: Reports Difficulty getting out of chair: Denies Difficulty using hands for taps, buttons, cutlery, and/or writing: Reports  Review of Systems  Constitutional:  Positive for fatigue.  HENT:  Negative for mouth sores and mouth dryness.   Eyes:  Negative for dryness.  Respiratory:  Negative for shortness of breath.   Cardiovascular:  Negative for chest pain and palpitations.  Gastrointestinal:  Negative for blood in stool, constipation and diarrhea.  Endocrine: Negative for increased urination.  Genitourinary:  Negative for involuntary urination.  Musculoskeletal:  Positive for joint pain, joint pain, joint swelling, myalgias, muscle weakness, morning stiffness, muscle tenderness and myalgias. Negative for gait problem.  Skin:  Negative for color change, rash, hair loss and sensitivity to sunlight.  Allergic/Immunologic: Negative for susceptible to infections.  Neurological:  Positive for dizziness and headaches.  Hematological:  Negative for swollen glands.  Psychiatric/Behavioral:  Negative for depressed mood and sleep disturbance. The patient is not nervous/anxious.     PMFS History:  Patient Active Problem List   Diagnosis Date Noted   Gout 09/02/2022   Bilateral hand pain 08/09/2022   Positive ANA (antinuclear antibody) 08/09/2022   Great toe pain, right 08/09/2022    Past Medical History:  Diagnosis Date   Arthritis    Hypertension  Family History  Problem Relation Age of Onset   Stroke Mother    Lung disease Father    Stroke Brother    Stroke Son    Past Surgical History:  Procedure Laterality Date   APPENDECTOMY  1982   Social History   Social History Narrative   Not on file    There is no immunization history on file for this patient.   Objective: Vital Signs: BP 117/71 (BP Location: Right Arm, Patient Position:  Sitting, Cuff Size: Normal)   Pulse 76   Resp 14   Ht 5' 11.5" (1.816 m)   Wt 248 lb (112.5 kg)   BMI 34.11 kg/m    Physical Exam Eyes:     Comments: Arcus senilis  Cardiovascular:     Rate and Rhythm: Normal rate and regular rhythm.  Pulmonary:     Effort: Pulmonary effort is normal.     Breath sounds: Normal breath sounds.  Musculoskeletal:     Right lower leg: No edema.     Left lower leg: No edema.  Skin:    General: Skin is warm and dry.     Findings: No rash.  Neurological:     Mental Status: He is alert.  Psychiatric:        Mood and Affect: Mood normal.      Musculoskeletal Exam:  Neck full ROM no tenderness Shoulders full ROM no tenderness or swelling Elbows full ROM no tenderness or swelling Wrists full ROM no tenderness or swelling Fingers slightly decreased flexion ROM worst at PIP joints, tenderness, no palpable swelling Knees full ROM no tenderness or swelling Ankles full ROM no tenderness or swelling MTPs full ROM no tenderness or swelling, bony enlargement in 1st MTP joints, hyperpigmentation on dorsal side of right 1st MTP   Investigation: No additional findings.  Imaging: No results found.  Recent Labs: Lab Results  Component Value Date   WBC 4.1 10/18/2017   HGB 15.7 10/18/2017   PLT 196 10/18/2017   NA 136 10/18/2017   K 3.3 (L) 10/18/2017   CL 100 (L) 10/18/2017   CO2 26 10/18/2017   GLUCOSE 133 (H) 10/18/2017   BUN 11 10/18/2017   CREATININE 1.31 (H) 10/18/2017   CALCIUM 9.0 10/18/2017   GFRAA >60 10/18/2017    Speciality Comments: No specialty comments available.  Procedures:  No procedures performed Allergies: Patient has no known allergies.   Assessment / Plan:     Visit Diagnoses: Bilateral hand pain - Plan: XR Hand 2 View Right, XR Hand 2 View Left  No definite synovitis has some joint tenderness and difficulty with complete range of motion that could be consistent with inflammation.  X-ray of bilateral hands shows  very mild extent of osteoarthritis not sure entirely accounts for the proximal finger joint symptoms.  If serum inflammatory markers are normal consider checking musculoskeletal ultrasound exam on follow-up.  Positive ANA (antinuclear antibody) - Plan: Rheumatoid factor, Cyclic citrul peptide antibody, IgG, Sedimentation rate, C-reactive protein, Anti-Smith antibody, Sjogrens syndrome-A extractable nuclear antibody, Anti-DNA antibody, double-stranded, RNP Antibody  Positive ANA does not have concerning systemic clinical symptoms outside of the arthritis.  Will check specific antibody markers today also checking rheumatoid arthritis screening.  Great toe pain, right - Plan: Uric acid  The pain and swelling of the right great toe sounds fairly typical for podagra but also has some degenerative changes at that joint.  The original episode and reported inflammation is improved by this time so not much to  see on exam.  Checking uric acid level outside of the flare should be accurate will be more suspicious if there is significant hyperuricemia.  Orders: Orders Placed This Encounter  Procedures   XR Hand 2 View Right   XR Hand 2 View Left   Rheumatoid factor   Cyclic citrul peptide antibody, IgG   Sedimentation rate   C-reactive protein   Uric acid   Anti-Smith antibody   Sjogrens syndrome-A extractable nuclear antibody   Anti-DNA antibody, double-stranded   RNP Antibody   No orders of the defined types were placed in this encounter.    Follow-Up Instructions: Return in about 3 weeks (around 08/30/2022) for New pt arthritis f/u 3 wks.   Collier Salina, MD  Note - This record has been created using [employer].  Chart creation errors have been sought, but may not always  have been located. Such creation errors do not reflect on  the standard of medical care.

## 2022-08-12 LAB — ANTI-DNA ANTIBODY, DOUBLE-STRANDED: ds DNA Ab: <1 IU/mL

## 2022-08-12 LAB — URIC ACID: Uric Acid, Serum: 8.8 mg/dL — ABNORMAL HIGH (ref 4.0–8.0)

## 2022-08-12 LAB — ANTI-SMITH ANTIBODY: ENA SM Ab Ser-aCnc: <1 AI

## 2022-08-12 LAB — SJOGRENS SYNDROME-A EXTRACTABLE NUCLEAR ANTIBODY: SSA (Ro) (ENA) Antibody, IgG: <1 AI

## 2022-08-12 LAB — SEDIMENTATION RATE: Sed Rate: 2 mm/h (ref 0–20)

## 2022-08-12 LAB — RHEUMATOID FACTOR: Rhuematoid fact SerPl-aCnc: <14 IU/mL (ref <14)

## 2022-08-12 LAB — RNP ANTIBODY: Ribonucleic Protein(ENA) Antibody, IgG: <1 AI

## 2022-08-12 LAB — C-REACTIVE PROTEIN: CRP: 15.2 mg/L — ABNORMAL HIGH (ref <8.0)

## 2022-08-12 LAB — CYCLIC CITRUL PEPTIDE ANTIBODY, IGG: Cyclic Citrullin Peptide Ab: <16 UNITS

## 2022-08-18 ENCOUNTER — Ambulatory Visit: Payer: Medicare Other

## 2022-08-18 ENCOUNTER — Ambulatory Visit
Admission: RE | Admit: 2022-08-18 | Discharge: 2022-08-18 | Disposition: A | Discharge status: Home / Self Care | Payer: Medicare Other | Source: Ambulatory Visit | Attending: Student | Admitting: Student

## 2022-08-18 DIAGNOSIS — N6310 Unspecified lump in the right breast, unspecified quadrant: Secondary | ICD-10-CM

## 2022-08-19 NOTE — Progress Notes (Unsigned)
Office Visit Note  Patient: Tommy Young             Date of Birth: [birth year]           MRN: [id number]             PCP: Cipriano Mile, NP Referring: Cipriano Mile, NP Visit Date: 09/02/2022   Subjective:  No chief complaint on file.   History of Present Illness: Tommy Young is a 71 y.o. male here for follow up ***   Still hand pain and stiffness b/l Right great toe very large, almost 0 dorsiflexion ***  Previous HPI 08/09/2022  Tommy Young is a 71 y.o. male here for evaluation of positive ANA associated with joint pains.  Symptoms started since earlier this year he was initially seen in primary care office about the problem in April but had some right great toe swelling for at least weeks or possibly a few months prior to this.  He was treated with indomethacin with a good improvement in that pain and swelling.  However sometime between April and June also developed worsening joint pain and stiffness in other areas particularly bilateral hands.  This was not associated with much visible swelling but does have daily pain.  Gets a little bit better during the day but never fully gone and only partially improved when taking the indomethacin.  He denies any numbness denies lack of strength but has difficulty gripping tightly for example to open jars due to the associated pain.  He is also often unable to fully close his grip range of motion.  He does not recall any preceding illness or medical changes before the onset of symptoms.  Laboratory testing showed a low positive ANA his uric acid was in normal range at 6.5 and sedimentation rate was normal at 11.  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  He is never experienced symptoms similar to this in the past.  He has no previous history of gout.   Labs reviewed ANA 1:40 speckled 1:40 cytoplasmic Uric  acid 6.5 ESR 11 CBC unremarkable   Activities of Daily Living:  Patient reports morning stiffness for 2-3 minutes.   Patient Denies nocturnal pain.  Difficulty dressing/grooming: Denies Difficulty climbing stairs: Reports Difficulty getting out of chair: Denies Difficulty using hands for taps, buttons, cutlery, and/or writing: Reports   Review of Systems  Constitutional:  Negative for fatigue.  HENT:  Negative for mouth sores and mouth dryness.   Eyes:  Negative for dryness.  Respiratory:  Negative for shortness of breath.   Cardiovascular:  Negative for chest pain and palpitations.  Gastrointestinal:  Negative for blood in stool, constipation and diarrhea.  Endocrine: Negative for increased urination.  Genitourinary:  Negative for involuntary urination.  Musculoskeletal:  Positive for joint pain, joint pain, myalgias, morning stiffness and myalgias. Negative for gait problem, joint swelling, muscle weakness and muscle tenderness.  Skin:  Negative for color change, rash, hair loss and sensitivity to sunlight.  Allergic/Immunologic: Negative for susceptible to infections.  Neurological:  Positive for dizziness. Negative for headaches.  Hematological:  Negative for swollen glands.  Psychiatric/Behavioral:  Negative for depressed mood and sleep disturbance. The patient is not nervous/anxious.     PMFS History:  Patient Active Problem List   Diagnosis Date Noted   Bilateral hand pain 08/09/2022   Positive ANA (antinuclear antibody) 08/09/2022  Great toe pain, right 08/09/2022    Past Medical History:  Diagnosis Date   Arthritis    Hypertension     Family History  Problem Relation Age of Onset   Stroke Mother    Lung disease Father    Stroke Brother    Stroke Son    Past Surgical History:  Procedure Laterality Date   APPENDECTOMY  1982   Social History   Social History Narrative   Not on file    There is no immunization history on file for this patient.    Objective: Vital Signs: BP 127/77 (BP Location: Left Arm, Patient Position: Sitting, Cuff Size: Small)   Pulse 75   Resp 12   Ht 6' (1.829 m)   Wt 247 lb 12.8 oz (112.4 kg)   BMI 33.61 kg/m    Physical Exam   Musculoskeletal Exam: ***  CDAI Exam: CDAI Score: -- Patient Global: --; Provider Global: -- Swollen: --; Tender: -- Joint Exam 09/02/2022   No joint exam has been documented for this visit   There is currently no information documented on the homunculus. Go to the Rheumatology activity and complete the homunculus joint exam.  Investigation: No additional findings.  Imaging: MM DIAG BREAST TOMO BILATERAL  Result Date: 08/18/2022 CLINICAL DATA:  71 year old male presenting for evaluation of right breast pain. EXAM: DIGITAL DIAGNOSTIC BILATERAL MAMMOGRAM WITH TOMOSYNTHESIS TECHNIQUE: Bilateral digital diagnostic mammography and breast tomosynthesis was performed. COMPARISON:  None. ACR Breast Density Category a: The breast tissue is almost entirely fatty. FINDINGS: In the bilateral breasts, right greater than left there is a moderate amount of flame shaped fibroglandular tissue consistent with benign gynecomastia. No suspicious calcifications, masses or areas of distortion are seen in the bilateral breasts. IMPRESSION: 1. Benign gynecomastia is noted in the right breast, which is a known source of male breast pain. 2. No suspicious calcifications, masses or areas of distortion are seen in the bilateral breasts. RECOMMENDATION: Clinical follow-up for symptomatic benign gynecomastia. I have discussed the findings and recommendations with the patient. If applicable, a reminder letter will be sent to the patient regarding the next appointment. BI-RADS CATEGORY  2: Benign. Electronically Signed   By: Ammie Ferrier M.D.   On: 08/18/2022 13:33   Recent Labs: Lab Results  Component Value Date   WBC 4.1 10/18/2017   HGB 15.7 10/18/2017   PLT 196 10/18/2017   NA 136 10/18/2017    K 3.3 (L) 10/18/2017   CL 100 (L) 10/18/2017   CO2 26 10/18/2017   GLUCOSE 133 (H) 10/18/2017   BUN 11 10/18/2017   CREATININE 1.31 (H) 10/18/2017   CALCIUM 9.0 10/18/2017   GFRAA >60 10/18/2017    Speciality Comments: No specialty comments available.  Procedures:  No procedures performed Allergies: Patient has no known allergies.   Assessment / Plan:     Visit Diagnoses: Bilateral hand pain  Positive ANA (antinuclear antibody)  Great toe pain, right  ***  Orders: No orders of the defined types were placed in this encounter.  No orders of the defined types were placed in this encounter.    Follow-Up Instructions: No follow-ups on file.   Collier Salina, MD  Note - This record has been created using [employer].  Chart creation errors have been sought, but may not always  have been located. Such creation errors do not reflect on  the standard of medical care.

## 2022-09-02 ENCOUNTER — Encounter: Payer: Self-pay | Admitting: Internal Medicine

## 2022-09-02 ENCOUNTER — Ambulatory Visit: Payer: Medicare Other | Attending: Internal Medicine | Admitting: Internal Medicine

## 2022-09-02 VITALS — BP 127/77 | HR 75 | Resp 12 | Ht 72.0 in | Wt 247.8 lb

## 2022-09-02 DIAGNOSIS — R768 Other specified abnormal immunological findings in serum: Secondary | ICD-10-CM | POA: Diagnosis not present

## 2022-09-02 DIAGNOSIS — M79674 Pain in right toe(s): Secondary | ICD-10-CM | POA: Diagnosis not present

## 2022-09-02 DIAGNOSIS — M79642 Pain in left hand: Secondary | ICD-10-CM

## 2022-09-02 DIAGNOSIS — M109 Gout, unspecified: Secondary | ICD-10-CM | POA: Insufficient documentation

## 2022-09-02 DIAGNOSIS — M79641 Pain in right hand: Secondary | ICD-10-CM | POA: Diagnosis not present

## 2022-09-02 DIAGNOSIS — M1A9XX0 Chronic gout, unspecified, without tophus (tophi): Secondary | ICD-10-CM

## 2022-09-02 MED ORDER — ALLOPURINOL 100 MG PO TABS: 100.0000 mg | ORAL_TABLET | Freq: Every day | ORAL | 2 refills | Status: DC

## 2022-09-02 NOTE — Patient Instructions (Signed)

## 2022-10-27 ENCOUNTER — Other Ambulatory Visit: Payer: Self-pay | Admitting: Internal Medicine

## 2022-10-27 DIAGNOSIS — M1A9XX0 Chronic gout, unspecified, without tophus (tophi): Secondary | ICD-10-CM

## 2022-12-06 NOTE — Progress Notes (Unsigned)
Office Visit Note  Patient: Tommy Young             Date of Birth: August 14, 1951           MRN: [id number]             PCP: Cipriano Mile, NP Referring: Cipriano Mile, NP Visit Date: 12/07/2022   Subjective:  No chief complaint on file.   History of Present Illness: Tommy Young is a 72 y.o. male here for follow up ***   Previous HPI 09/02/22  Tommy Young is a 72 y.o. male here for follow up for multiple joint pains especially in the hands and feet with stiffness and swelling in the right great toe.  Lab testing at our initial visit showed only very low positive ANA at 1: 40 and no specific disease antibody markers.  Uric acid was mildly elevated at 8.8 and CRP of 15.  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  He continues having hand pain and stiffness on a daily basis most commonly around the MCP joints.  He has a good benefit with the meloxicam and low-dose tramadol.  He has not seen a return of swelling or redness in his foot but continues having some pain around the big toe and very limited mobility in this joint.     Previous HPI 08/09/2022  [name] is a 73 y.o. male here for evaluation of positive ANA associated with joint pains.  Symptoms started since earlier this year he was initially seen in primary care office about the problem in April but had some right great toe swelling for at least weeks or possibly a few months prior to this.  He was treated with indomethacin with a good improvement in that pain and swelling.  However sometime between April and June also developed worsening joint pain and stiffness in other areas particularly bilateral hands.  This was not associated with much visible swelling but does have daily pain.  Gets a little bit better during the day but never fully gone and only partially improved when taking the indomethacin.  He denies any numbness denies lack of  strength but has difficulty gripping tightly for example to open jars due to the associated pain.  He is also often unable to fully close his grip range of motion.  He does not recall any preceding illness or medical changes before the onset of symptoms.  Laboratory testing showed a low positive ANA his uric acid was in normal range at 6.5 and sedimentation rate was normal at 11.  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  He is never experienced symptoms similar to this in the past.  He has no previous history of gout.   Labs reviewed ANA 1:40 speckled 1:40 cytoplasmic Uric acid 6.5 ESR 11 CBC unremarkable   No Rheumatology ROS completed.   PMFS History:  Patient Active Problem List   Diagnosis Date Noted   Gout 09/02/2022   Bilateral hand pain 08/09/2022   Positive ANA (antinuclear antibody) 08/09/2022   Great toe pain, right 08/09/2022    Past Medical History:  Diagnosis Date   Arthritis    Hypertension     Family History  Problem Relation Age of Onset   Stroke Mother    Lung disease Father    Stroke Brother    Stroke  Son    Past Surgical History:  Procedure Laterality Date   APPENDECTOMY  30   Social History   Social History Narrative   Not on file    There is no immunization history on file for this patient.   Objective: Vital Signs: There were no vitals taken for this visit.   Physical Exam   Musculoskeletal Exam: ***  CDAI Exam: CDAI Score: -- Patient Global: --; Provider Global: -- Swollen: --; Tender: -- Joint Exam 12/07/2022   No joint exam has been documented for this visit   There is currently no information documented on the homunculus. Go to the Rheumatology activity and complete the homunculus joint exam.  Investigation: No additional findings.  Imaging: No results found.  Recent Labs: Lab Results  Component Value  Date   WBC 4.1 10/18/2017   HGB 15.7 10/18/2017   PLT 196 10/18/2017   NA 136 10/18/2017   K 3.3 (L) 10/18/2017   CL 100 (L) 10/18/2017   CO2 26 10/18/2017   GLUCOSE 133 (H) 10/18/2017   BUN 11 10/18/2017   CREATININE 1.31 (H) 10/18/2017   CALCIUM 9.0 10/18/2017   GFRAA >60 10/18/2017    Speciality Comments: No specialty comments available.  Procedures:  No procedures performed Allergies: Patient has no known allergies.   Assessment / Plan:     Visit Diagnoses: No diagnosis found.  ***  Orders: No orders of the defined types were placed in this encounter.  No orders of the defined types were placed in this encounter.    Follow-Up Instructions: No follow-ups on file.   Collier Salina, MD  Note - This record has been created using [employer].  Chart creation errors have been sought, but may not always  have been located. Such creation errors do not reflect on  the standard of medical care.

## 2022-12-07 ENCOUNTER — Encounter: Payer: Self-pay | Admitting: Internal Medicine

## 2022-12-07 ENCOUNTER — Ambulatory Visit: Payer: Medicare HMO | Attending: Internal Medicine | Admitting: Internal Medicine

## 2022-12-07 VITALS — BP 118/74 | HR 78 | Ht 72.0 in | Wt 244.0 lb

## 2022-12-07 DIAGNOSIS — R7689 Other specified abnormal immunological findings in serum: Secondary | ICD-10-CM

## 2022-12-07 DIAGNOSIS — M1A9XX0 Chronic gout, unspecified, without tophus (tophi): Secondary | ICD-10-CM | POA: Diagnosis not present

## 2022-12-07 DIAGNOSIS — R079 Chest pain, unspecified: Secondary | ICD-10-CM

## 2022-12-07 DIAGNOSIS — M79642 Pain in left hand: Secondary | ICD-10-CM

## 2022-12-07 DIAGNOSIS — R768 Other specified abnormal immunological findings in serum: Secondary | ICD-10-CM | POA: Diagnosis not present

## 2022-12-07 DIAGNOSIS — M79641 Pain in right hand: Secondary | ICD-10-CM | POA: Diagnosis not present

## 2022-12-07 NOTE — Patient Instructions (Signed)

## 2022-12-08 LAB — SEDIMENTATION RATE: Sed Rate: 17 mm/h (ref 0–20)

## 2022-12-08 LAB — C-REACTIVE PROTEIN: CRP: 15.7 mg/L — ABNORMAL HIGH (ref <8.0)

## 2022-12-08 LAB — URIC ACID: Uric Acid, Serum: 8.4 mg/dL — ABNORMAL HIGH (ref 4.0–8.0)

## 2022-12-08 MED ORDER — ALLOPURINOL 300 MG PO TABS: 300.0000 mg | ORAL_TABLET | Freq: Every day | ORAL | 1 refills | Status: AC

## 2022-12-08 NOTE — Addendum Note (Signed)
Addended by: Collier Salina on: 12/08/2022 07:48 AM   Modules accepted: Orders

## 2022-12-08 NOTE — Progress Notes (Signed)
Uric acid is 8.4 only slightly improved with adding the allopurinol. Will change prescription to 300 mg tablet once daily. We can schedule follow up in 6 months or if he has a flare.

## 2023-05-30 NOTE — Progress Notes (Deleted)
Office Visit Note  Patient: Tommy Young             Date of Birth: January 10, 1951           MRN: [id number]             PCP: Hillery Aldo, NP Referring: Hillery Aldo, NP Visit Date: 06/13/2023   Subjective:  No chief complaint on file.   History of Present Illness: Tommy Young is a 72 y.o. male here for follow up for arthralgias involving bilateral hands and on the right side of the chest.    Previous HPI 12/07/2022 Tommy Young is a 72 y.o. male here for follow up for arthralgias involving bilateral hands and on the right side of the chest.  He started the allopurinol 100 mg daily as directed no new flareups of gout.  Developed some pain and stiffness localized most to the PIP joints on both hands gets worse with pressure and use.  He does not see any definite swelling or noticed large difference in symptoms between morning and late in the day.  Right-sided chest wall pain remains about the same comes and goes but mostly provoked with pressure especially if he lies on the right side.  Takes meloxicam 15 mg which helps a moderate amount.   Previous HPI 09/02/22  Tommy Young is a 72 y.o. male here for follow up for multiple joint pains especially in the hands and feet with stiffness and swelling in the right great toe.  Lab testing at our initial visit showed only very low positive ANA at 1:40 and no specific disease antibody markers.  Uric acid was mildly elevated at 8.8 and CRP of 15.  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  He continues having hand pain and stiffness on a daily basis most commonly around the MCP joints.  He has a good benefit with the meloxicam and low-dose tramadol.  He has not seen a return of swelling or redness in his foot but continues having some pain around the big toe and very limited mobility in this joint.   Previous HPI 08/09/2022  Tommy Young is a 72 y.o.  male here for evaluation of positive ANA associated with joint pains.  Symptoms started since earlier this year he was initially seen in primary care office about the problem in April but had some right great toe swelling for at least weeks or possibly a few months prior to this.  He was treated with indomethacin with a good improvement in that pain and swelling.  However sometime between April and June also developed worsening joint pain and stiffness in other areas particularly bilateral hands.  This was not associated with much visible swelling but does have daily pain.  Gets a little bit better during the day but never fully gone and only partially improved when taking the indomethacin.  He denies any numbness denies lack of strength but has difficulty gripping tightly for example to open jars due to the associated pain.  He is also often unable to fully close his grip range of motion.  He does not recall any preceding illness or medical changes before the onset of symptoms.  Laboratory testing showed a low positive ANA his uric acid was in normal range at 6.5 and sedimentation rate was normal at 11.  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  He is never experienced symptoms similar to this in the past.  He has no previous history of gout.   Labs reviewed ANA 1:40 speckled 1:40 cytoplasmic Uric acid 6.5 ESR 11 CBC unremarkable   No Rheumatology ROS completed.   PMFS History:  Patient Active Problem List   Diagnosis Date Noted   Right-sided chest pain 12/07/2022   Gout 09/02/2022   Bilateral hand pain 08/09/2022   Positive ANA (antinuclear antibody) 08/09/2022   Great toe pain, right 08/09/2022    Past Medical History:  Diagnosis Date   Arthritis    Hypertension     Family History  Problem Relation Age of Onset   Stroke Mother    Lung disease Father    Stroke Brother     Stroke Son    Past Surgical History:  Procedure Laterality Date   APPENDECTOMY  1982   Social History   Social History Narrative   Not on file    There is no immunization history on file for this patient.   Objective: Vital Signs: There were no vitals taken for this visit.   Physical Exam   Musculoskeletal Exam: ***  CDAI Exam: CDAI Score: -- Patient Global: --; Provider Global: -- Swollen: --; Tender: -- Joint Exam 06/13/2023   No joint exam has been documented for this visit   There is currently no information documented on the homunculus. Go to the Rheumatology activity and complete the homunculus joint exam.  Investigation: No additional findings.  Imaging: No results found.  Recent Labs: Lab Results  Component Value Date   WBC 4.1 10/18/2017   HGB 15.7 10/18/2017   PLT 196 10/18/2017   NA 136 10/18/2017   K 3.3 (L) 10/18/2017   CL 100 (L) 10/18/2017   CO2 26 10/18/2017   GLUCOSE 133 (H) 10/18/2017   BUN 11 10/18/2017   CREATININE 1.31 (H) 10/18/2017   CALCIUM 9.0 10/18/2017   GFRAA >60 10/18/2017    Speciality Comments: No specialty comments available.  Procedures:  No procedures performed Allergies: Patient has no known allergies.   Assessment / Plan:     Visit Diagnoses: No diagnosis found.  ***  Orders: No orders of the defined types were placed in this encounter.  No orders of the defined types were placed in this encounter.    Follow-Up Instructions: No follow-ups on file.   Metta Clines, RT  Note - This record has been created using [employer].  Chart creation errors have been sought, but may not always  have been located. Such creation errors do not reflect on  the standard of medical care.

## 2023-06-13 ENCOUNTER — Ambulatory Visit: Payer: Medicare HMO | Admitting: Internal Medicine

## 2023-06-13 DIAGNOSIS — Z5181 Encounter for therapeutic drug level monitoring: Secondary | ICD-10-CM

## 2023-06-13 DIAGNOSIS — R768 Other specified abnormal immunological findings in serum: Secondary | ICD-10-CM

## 2023-06-13 DIAGNOSIS — M79642 Pain in left hand: Secondary | ICD-10-CM

## 2023-06-13 DIAGNOSIS — R079 Chest pain, unspecified: Secondary | ICD-10-CM

## 2023-06-13 DIAGNOSIS — M1A9XX0 Chronic gout, unspecified, without tophus (tophi): Secondary | ICD-10-CM

## 2024-01-17 ENCOUNTER — Other Ambulatory Visit: Payer: Self-pay | Admitting: Internal Medicine

## 2024-01-17 DIAGNOSIS — M1A9XX0 Chronic gout, unspecified, without tophus (tophi): Secondary | ICD-10-CM
